# Patient Record
Sex: Male | Born: 1985 | Race: Black or African American | Hispanic: No | Marital: Single
Health system: Southern US, Community
[De-identification: ages and names within clinical notes are randomized; demographics above are authoritative.]

## PROBLEM LIST (undated history)

## (undated) ENCOUNTER — Emergency Department: Admission: EM | Payer: Self-pay | Source: Home / Self Care

---
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
## 2015-06-13 ENCOUNTER — Emergency Department: Payer: Self-pay

## 2015-06-13 ENCOUNTER — Encounter: Payer: Self-pay | Admitting: Emergency Medicine

## 2015-06-13 ENCOUNTER — Emergency Department
Admission: EM | Admit: 2015-06-13 | Discharge: 2015-06-13 | Disposition: A | Discharge status: Home / Self Care | Payer: Self-pay | Attending: Emergency Medicine | Admitting: Emergency Medicine

## 2015-06-13 DIAGNOSIS — Y9389 Activity, other specified: Secondary | ICD-10-CM | POA: Insufficient documentation

## 2015-06-13 DIAGNOSIS — S81811A Laceration without foreign body, right lower leg, initial encounter: Secondary | ICD-10-CM | POA: Insufficient documentation

## 2015-06-13 DIAGNOSIS — Y929 Unspecified place or not applicable: Secondary | ICD-10-CM | POA: Insufficient documentation

## 2015-06-13 DIAGNOSIS — IMO0002 Reserved for concepts with insufficient information to code with codable children: Secondary | ICD-10-CM

## 2015-06-13 DIAGNOSIS — W293XXA Contact with powered garden and outdoor hand tools and machinery, initial encounter: Secondary | ICD-10-CM | POA: Insufficient documentation

## 2015-06-13 DIAGNOSIS — Y999 Unspecified external cause status: Secondary | ICD-10-CM | POA: Insufficient documentation

## 2015-06-13 MED ORDER — HYDROMORPHONE HCL 1 MG/ML IJ SOLN
INTRAMUSCULAR | Status: AC
  Administered 2015-06-13: 1 mg via INTRAVENOUS
  Filled 2015-06-13: qty 1

## 2015-06-13 MED ORDER — LIDOCAINE-EPINEPHRINE 2 %-1:100000 IJ SOLN
INTRAMUSCULAR | Status: AC
  Filled 2015-06-13: qty 1.7

## 2015-06-13 MED ORDER — LIDOCAINE-EPINEPHRINE (PF) 1 %-1:200000 IJ SOLN
INTRAMUSCULAR | Status: AC
  Filled 2015-06-13: qty 30

## 2015-06-13 MED ORDER — HYDROMORPHONE HCL 1 MG/ML IJ SOLN
1.0000 mg | Freq: Once | INTRAMUSCULAR | Status: AC
  Administered 2015-06-13: 1 mg via INTRAVENOUS
  Filled 2015-06-13: qty 1

## 2015-06-13 MED ORDER — MUPIROCIN 2 % EX OINT: TOPICAL_OINTMENT | CUTANEOUS | Status: AC

## 2015-06-13 MED ORDER — HYDROMORPHONE HCL 1 MG/ML IJ SOLN
1.0000 mg | Freq: Once | INTRAMUSCULAR | Status: AC
  Administered 2015-06-13: 1 mg via INTRAVENOUS

## 2015-06-13 MED ORDER — BACITRACIN ZINC 500 UNIT/GM EX OINT
TOPICAL_OINTMENT | CUTANEOUS | Status: AC
  Filled 2015-06-13: qty 1.8

## 2015-06-13 MED ORDER — CEFAZOLIN SODIUM 1-5 GM-% IV SOLN
1.0000 g | Freq: Once | INTRAVENOUS | Status: AC
  Administered 2015-06-13: 1 g via INTRAVENOUS
  Filled 2015-06-13: qty 50

## 2015-06-13 MED ORDER — CEPHALEXIN 500 MG PO CAPS
500.0000 mg | ORAL_CAPSULE | Freq: Four times a day (QID) | ORAL | Status: AC
Start: 2015-06-13 — End: 2015-06-23

## 2015-06-13 MED ORDER — TETANUS-DIPHTH-ACELL PERTUSSIS 5-2.5-18.5 LF-MCG/0.5 IM SUSP
0.5000 mL | Freq: Once | INTRAMUSCULAR | Status: AC
  Administered 2015-06-13: 0.5 mL via INTRAMUSCULAR
  Filled 2015-06-13: qty 0.5

## 2015-06-13 MED ORDER — OXYCODONE-ACETAMINOPHEN 7.5-325 MG PO TABS: 1.0000 | ORAL_TABLET | ORAL | Status: AC | PRN

## 2015-06-13 NOTE — ED Provider Notes (Signed)
[facility] Emergency Department Provider Note     Time seen: ----------------------------------------- 11:50 AM on 06/13/2015 -----------------------------------------    I have reviewed the triage vital signs and the nursing notes.   HISTORY  Chief Complaint Extremity Laceration    HPI Kyle Pierce is a 30 y.o. male was working with a chainsaw neck still he cut his right lower leg just prior to arrival. Patient states it is been bleeding significantly.   No past medical history on file.  There are no active problems to display for this patient.   No past surgical history on file.  Allergies Review of patient's allergies indicates not on file.  Social History Social History  Substance Use Topics  . Smoking status: Not on file  . Smokeless tobacco: Not on file  . Alcohol Use: Not on file    Review of Systems Constitutional: Negative for fever. Cardiovascular: Negative for chest pain. Respiratory: Negative for shortness of breath. Musculoskeletal: Positive for right leg pain Skin: Positive for right leg laceration Neurological: Negative for headaches, focal weakness or numbness.  ____________________________________________   PHYSICAL EXAM:  VITAL SIGNS: ED Triage Vitals  Enc Vitals Group     BP --      Pulse --      Resp --      Temp --      Temp src --      SpO2 --      Weight --      Height --      Head Cir --      Peak Flow --      Pain Score --      Pain Loc --      Pain Edu? --      Excl. in GC? --    Constitutional: Alert and oriented. Mildly anxious, no acute distress Eyes: Conjunctivae are normal. PERRL. Normal extraocular movements. Musculoskeletal: Extensive right lower extremity maceration located inferiorly lateral to the right knee. Visible muscle tissue is present. Neurologic:  Normal speech and language. No gross focal neurologic deficits are appreciated. Gait limited by pain Skin:  Extensive deep  laceration located over the right lower extremity, inferior medial to the right knee. Psychiatric: Mood and affect are normal. Speech and behavior are normal. Patient exhibits appropriate insight and judgment. ___________________________________________  ED COURSE:  Pertinent labs & imaging results that were available during my care of the patient were reviewed by me and considered in my medical decision making (see chart for details). Patient with extensive right lower extremity laceration, will require irrigation, I will obtain basic x-rays and give IV Ancef.  LACERATION REPAIR Performed by: Emily Filbert Authorized by: Daryel November E Consent: Verbal consent obtained. Risks and benefits: risks, benefits and alternatives were discussed Consent given by: patient Patient identity confirmed: provided demographic data Prepped and Draped in normal sterile fashion Wound explored  Laceration Location: Right lower extremity  Laceration Length: 20 cm  No Foreign Bodies seen or palpated  Anesthesia: local infiltration  Local anesthetic: lidocaine 1 % with epinephrine  Anesthetic total: 20 ml  Irrigation method: syringe Amount of cleaning: standard  Skin closure: Superficially 36 interrupted with occasional mattress stitch, 3-0 Prolene, also intermittently a running 5-0 Prolene for better skin closure  Deep sutures were 4-0 Vicryl, total of 4 were placed.   Number of sutures: 40   Technique: Interrupted, running and mattress stitch   Patient tolerance: Patient tolerated the procedure well with no immediate complications. ____________________________________________  RADIOLOGY Images were viewed by me  Right tib-fib x-ray Was unremarkable except for soft tissue injury ____________________________________________  FINAL ASSESSMENT AND PLAN  Chainsaw injury, complicated multilayer right lower extremity laceration  Plan: Patient with imaging as dictated above.  Patient with extensive wound repair, he received IV pain medicine and Ancef here. Wound was copiously irrigated and reapproximated with good cosmetic result. Advised follow-up. 1-2 days for wound check, follow-up with orthopedics as well. He is stable for discharge.   Emily FilbertWilliams, Jonathan E, MD   Emily FilbertJonathan E Williams, MD 06/13/15 1420

## 2015-06-13 NOTE — ED Notes (Signed)
Dr. Mayford KnifeWilliams placed 4 internal sutures and 36 external sutures on lateral lower leg wound.  Bacitracin ointment placed on wound and 4x4 & wrapped in Kerlix.  Wound oozing slightly prior to discharge.  Pt sent with one roll of kerlix

## 2015-06-13 NOTE — ED Notes (Signed)
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  Pt presents with large gash in R lateral shin just below the knee.  Bleeding on arrival was minimal, due to the patient has placed an effective tourniquet just after accident occurred.   Ble

## 2015-06-13 NOTE — Discharge Instructions (Signed)

## 2017-02-17 IMAGING — DX DG TIBIA/FIBULA 2V*R*
2 series · 2 of 2 positions shown · non-contrast
Comparison: None.

CLINICAL DATA: Chain saw injury

EXAM:
RIGHT TIBIA AND FIBULA - 2 VIEW

[tibia ap]
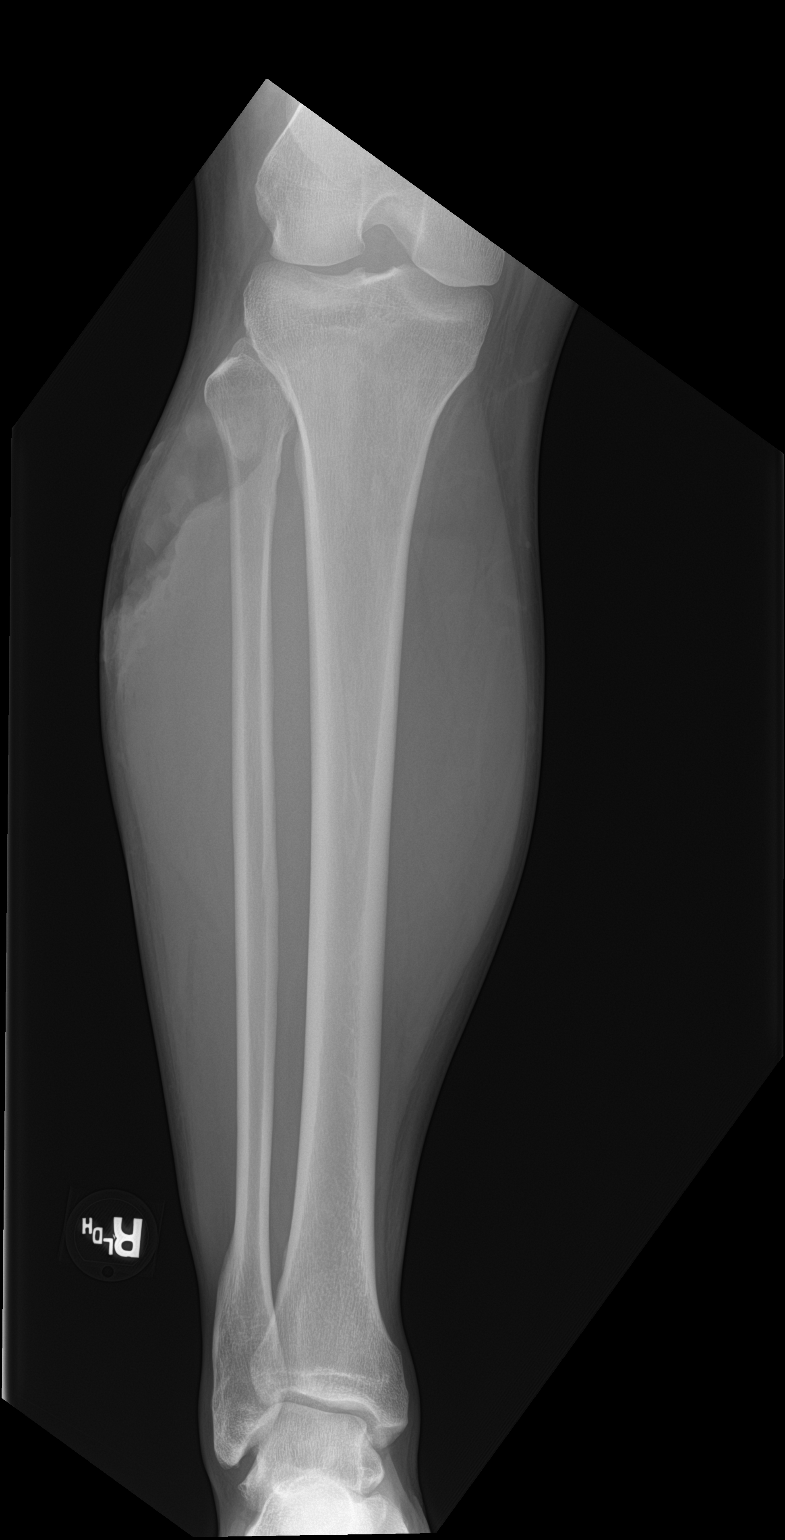

[tibia lat]
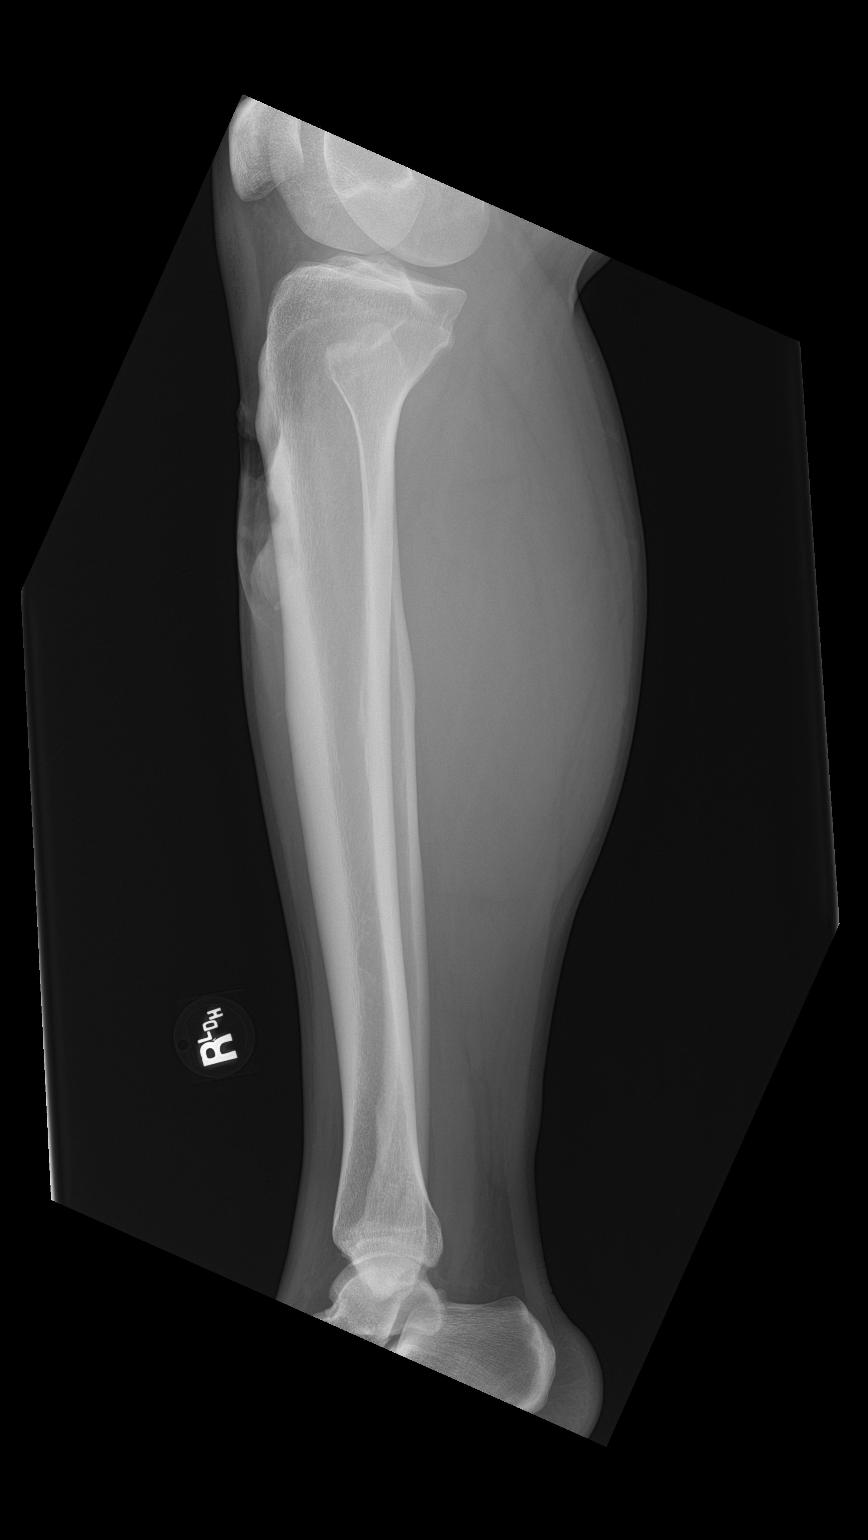

[2 of 2 positions shown; findings below may reference images not displayed]

FINDINGS: No acute fracture. No dislocation. Soft tissue injury in the
proximal anterior lateral leg is noted.
IMPRESSION: No acute fracture.

## 2017-04-15 ENCOUNTER — Other Ambulatory Visit
Admission: AD | Admit: 2017-04-15 | Discharge: 2017-04-15 | Disposition: A | Discharge status: Home / Self Care | Payer: Self-pay | Attending: Family Medicine | Admitting: Family Medicine

## 2017-04-15 NOTE — ED Notes (Signed)
Patient ambulatory to triage with steady gait, without difficulty or distress noted, in custody of [place] [employer] officer Whitman Hero for forensic blood draw; pt A&Ox3, with no c/o voiced and denies need to see ED provider; pt voices good understanding of blood draw to be performed for forensic testing; pt verifies identity with name and DOB; using sealed kit provided by officer, tourniquet applied to right upper arm; right antecubital region prepped with betadine swab and allowed to dry completely; needle inserted and 2 grey top blood tubes collected; tourniquet removed, needle removed & intact, dressing applied; tubes labeled, given to officer and placed in sealed container using chain of custody; pt tolerated well and continues to deny c/o or need to see ED provider; pt d/c in police custody

## 2019-07-07 ENCOUNTER — Ambulatory Visit: Payer: Self-pay

## 2022-03-09 ENCOUNTER — Emergency Department: Admission: EM | Admit: 2022-03-09 | Discharge: 2022-03-09 | Discharge status: Against Medical Advice | Payer: Self-pay

## 2022-03-09 NOTE — ED Notes (Signed)
No answer when called several times from lobby 

## 2022-03-09 NOTE — ED Triage Notes (Signed)
Pt provided with blue hospital scrubs by pt advocate so pt could change out of wet clothes. Pt changed clothes without assistance. Pt continues to wait for triage
